# Patient Record
Sex: Male | Born: 1965 | Race: White | Hispanic: No | Marital: Single | Smoking: Current every day smoker
Health system: Southern US, Community
[De-identification: ages and names within clinical notes are randomized; demographics above are authoritative.]

---

## 2021-07-12 ENCOUNTER — Emergency Department (HOSPITAL_COMMUNITY): Payer: BLUE CROSS/BLUE SHIELD

## 2021-07-12 ENCOUNTER — Encounter (HOSPITAL_COMMUNITY): Payer: Self-pay

## 2021-07-12 ENCOUNTER — Other Ambulatory Visit: Payer: Self-pay

## 2021-07-12 ENCOUNTER — Emergency Department (HOSPITAL_COMMUNITY)
Admission: EM | Admit: 2021-07-12 | Discharge: 2021-07-12 | Disposition: A | Discharge status: Home / Self Care | Payer: BLUE CROSS/BLUE SHIELD | Attending: Emergency Medicine | Admitting: Emergency Medicine

## 2021-07-12 DIAGNOSIS — Z955 Presence of coronary angioplasty implant and graft: Secondary | ICD-10-CM | POA: Insufficient documentation

## 2021-07-12 DIAGNOSIS — I2510 Atherosclerotic heart disease of native coronary artery without angina pectoris: Secondary | ICD-10-CM | POA: Diagnosis not present

## 2021-07-12 DIAGNOSIS — R519 Headache, unspecified: Secondary | ICD-10-CM | POA: Diagnosis not present

## 2021-07-12 DIAGNOSIS — R5383 Other fatigue: Secondary | ICD-10-CM | POA: Insufficient documentation

## 2021-07-12 DIAGNOSIS — R5381 Other malaise: Secondary | ICD-10-CM | POA: Diagnosis not present

## 2021-07-12 DIAGNOSIS — R7309 Other abnormal glucose: Secondary | ICD-10-CM | POA: Insufficient documentation

## 2021-07-12 DIAGNOSIS — R531 Weakness: Secondary | ICD-10-CM | POA: Insufficient documentation

## 2021-07-12 LAB — TSH: TSH: 1.052 u[IU]/mL (ref 0.350–4.500)

## 2021-07-12 LAB — CBC WITH DIFFERENTIAL/PLATELET
Abs Immature Granulocytes: 0.01 10*3/uL (ref 0.00–0.07)
Basophils Absolute: 0 10*3/uL (ref 0.0–0.1)
Basophils Relative: 0 %
Eosinophils Absolute: 0 10*3/uL (ref 0.0–0.5)
Eosinophils Relative: 1 %
HCT: 42.7 % (ref 39.0–52.0)
Hemoglobin: 14.4 g/dL (ref 13.0–17.0)
Immature Granulocytes: 0 %
Lymphocytes Relative: 20 %
Lymphs Abs: 1.4 10*3/uL (ref 0.7–4.0)
MCH: 32.4 pg (ref 26.0–34.0)
MCHC: 33.7 g/dL (ref 30.0–36.0)
MCV: 96.2 fL (ref 80.0–100.0)
Monocytes Absolute: 0.5 10*3/uL (ref 0.1–1.0)
Monocytes Relative: 6 %
Neutro Abs: 5.4 10*3/uL (ref 1.7–7.7)
Neutrophils Relative %: 73 %
Platelets: 248 10*3/uL (ref 150–400)
RBC: 4.44 MIL/uL (ref 4.22–5.81)
RDW: 12.4 % (ref 11.5–15.5)
WBC: 7.4 10*3/uL (ref 4.0–10.5)
nRBC: 0 % (ref 0.0–0.2)

## 2021-07-12 LAB — MAGNESIUM: Magnesium: 2.1 mg/dL (ref 1.7–2.4)

## 2021-07-12 LAB — COMPREHENSIVE METABOLIC PANEL
ALT: 25 U/L (ref 0–44)
AST: 21 U/L (ref 15–41)
Albumin: 3.9 g/dL (ref 3.5–5.0)
Alkaline Phosphatase: 49 U/L (ref 38–126)
Anion gap: 7 (ref 5–15)
BUN: 21 mg/dL — ABNORMAL HIGH (ref 6–20)
CO2: 25 mmol/L (ref 22–32)
Calcium: 9.3 mg/dL (ref 8.9–10.3)
Chloride: 108 mmol/L (ref 98–111)
Creatinine, Ser: 0.99 mg/dL (ref 0.61–1.24)
GFR, Estimated: >60 mL/min (ref >60)
Glucose, Bld: 119 mg/dL — ABNORMAL HIGH (ref 70–99)
Potassium: 4 mmol/L (ref 3.5–5.1)
Sodium: 140 mmol/L (ref 135–145)
Total Bilirubin: 0.6 mg/dL (ref 0.3–1.2)
Total Protein: 7.1 g/dL (ref 6.5–8.1)

## 2021-07-12 LAB — CK: Total CK: 84 U/L (ref 49–397)

## 2021-07-12 LAB — TROPONIN I (HIGH SENSITIVITY): Troponin I (High Sensitivity): 3 ng/L (ref <18)

## 2021-07-12 LAB — CBG MONITORING, ED: Glucose-Capillary: 106 mg/dL — ABNORMAL HIGH (ref 70–99)

## 2021-07-12 MED ORDER — SODIUM CHLORIDE 0.9 % IV BOLUS
1000.0000 mL | Freq: Once | INTRAVENOUS | Status: AC
  Administered 2021-07-12: 1000 mL via INTRAVENOUS

## 2021-07-12 NOTE — ED Provider Triage Note (Signed)
Emergency Medicine Provider Triage Evaluation Note  Shawn Macdonald , a 56 y.o. male  was evaluated in triage.  Pt complains of "feeling weird."  He reports that he woke up this morning and was feeling very weak and tingling all over.  Says that he may just be tired but something feels wrong.  Reports that on Wednesday he had a root canal and over the last few days he has also had a cold.  Has a history of STEMI but says this feels nothing like it.  Review of Systems  Positive: Tingly, fatigued and weak Negative: Chest pain, shortness of breath, syncope, palpitations  Physical Exam  BP 119/73 (BP Location: Right Arm)   Pulse 65   Temp 97.8 F (36.6 C) (Oral)   Resp 18   Ht 6' (1.829 m)   Wt 72.6 kg   SpO2 98%   BMI 21.70 kg/m  Gen:   Awake, no distress   Resp:  Normal effort  MSK:   Moves extremities without difficulty  Other:  Well-appearing.  No facial droop or word slurring.  Negative stroke scale, Van negative.  Medical Decision Making  Medically screening exam initiated at 10:27 AM.  Appropriate orders placed.  Shawn Macdonald was informed that the remainder of the evaluation will be completed by another provider, this initial triage assessment does not replace that evaluation, and the importance of remaining in the ED until their evaluation is complete.     Shawn Hammock, PA-C 07/12/21 1034

## 2021-07-12 NOTE — ED Notes (Signed)
Pt transported to xray 

## 2021-07-12 NOTE — ED Triage Notes (Signed)
Reports feeling weak and feeling worse by the minute.  Reports ate a fruit cup this am.  Patient complains of mild headache.  Patient denies any other pain.  Patient can't describes symptoms just keeps stating "something isnt right and I dont feel well I feel like I am giong to pass out".

## 2021-07-12 NOTE — ED Notes (Signed)
No pain

## 2021-07-12 NOTE — ED Provider Notes (Signed)
[facility] - Inpatient EMERGENCY DEPARTMENT Provider Note   CSN: [id number] Arrival date & time: 07/12/21  [id number]     History  Chief Complaint  Patient presents with   Weakness    Shawn Macdonald is a 56 y.o. male.  HPI Patient reports he is felt kind of generally weak and just not like himself the past day.  Patient does have history of coronary artery disease and LAD stent.  Also history of hyperlipidemia.  Patient got Repatha injection about 4 weeks ago.  He reports he did not feel well afterwards and thus did not go back for the 2-week repeat dosing.  No specific symptoms but he did feel kind of achy in the muscles in the joints.  Then about 2 weeks ago he felt pain specifically in the right shoulder and was seen by orthopedics and got a steroid injection in the right shoulder.  Patient is quite busy.  He had a 3 AM meeting this morning.  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  Reports he only got about 4 hours of sleep.  No drug or alcohol use.  Patient also had a root canal initiated yesterday.  He reports he is having no pain and did not need to take any pain medications.  He reports that he did get prescribed a Z-Pak but he has not yet started it.  Patient reports that after he went to the dentist he also then went to the chiropractor because he felt a little bit of muscular neck stiffness after sitting in the dental chair.  He denies any significant neck pain at this time.  He reports he has a little bit of muscular discomfort on the right but no significant pain.  He reports a mild frontal headache.  He thinks it is sinus related.  No documented fevers chills.  No rashes.  Patient denies having chest pain or shortness of breath and does not feel like this is similar to prior heart attack.    Home Medications Prior to Admission medications   Not on File      Allergies    Cephalexin, Iodinated contrast media, Doxycycline, Penicillins, Pravastatin, Rosuvastatin,  Nsaids, and Molds & smuts    Review of Systems   Review of Systems 10 systems reviewed negative except as per HPI Physical Exam Updated Vital Signs BP 104/74   Pulse 63   Temp 98.2 F (36.8 C)   Resp 13   Ht 6' (1.829 m)   Wt 72.6 kg   SpO2 98%   BMI 21.70 kg/m  Physical Exam Constitutional:      Appearance: Normal appearance.     Comments: Well-nourished well-developed.  No acute distress.  HENT:     Head:     Comments: No facial swelling or tenderness.    Right Ear: Tympanic membrane normal.     Left Ear: Tympanic membrane normal.     Nose: Nose normal.     Mouth/Throat:     Mouth: Mucous membranes are moist.     Pharynx: Oropharynx is clear.     Comments: Mucous membranes and posterior oropharynx are normal.  Dentition in the upper right showed some dental packing within the molars.  However the general condition is good.  There is no erythema or beefiness around the gums.  No tenderness no facial swelling no drainage. Eyes:     Extraocular Movements: Extraocular movements intact.     Pupils: Pupils are equal, round, and reactive  to light.  Cardiovascular:     Rate and Rhythm: Normal rate and regular rhythm.     Pulses: Normal pulses.     Heart sounds: Normal heart sounds.  Pulmonary:     Effort: Pulmonary effort is normal.     Breath sounds: Normal breath sounds.  Abdominal:     General: There is no distension.     Palpations: Abdomen is soft.     Tenderness: There is no abdominal tenderness. There is no guarding.  Musculoskeletal:        General: No swelling or tenderness. Normal range of motion.     Cervical back: Normal range of motion and neck supple. No rigidity or tenderness.     Right lower leg: No edema.     Left lower leg: No edema.  Lymphadenopathy:     Cervical: No cervical adenopathy.  Skin:    General: Skin is warm and dry.  Neurological:     General: No focal deficit present.     Mental Status: He is alert and oriented to person, place, and  time.     Motor: No weakness.     Coordination: Coordination normal.  Psychiatric:        Mood and Affect: Mood normal.    ED Results / Procedures / Treatments   Labs (all labs ordered are listed, but only abnormal results are displayed) Labs Reviewed  COMPREHENSIVE METABOLIC PANEL - Abnormal; Notable for the following components:      Result Value   Glucose, Bld 119 (*)    BUN 21 (*)    All other components within normal limits  CBG MONITORING, ED - Abnormal; Notable for the following components:   Glucose-Capillary 106 (*)    All other components within normal limits  CBC WITH DIFFERENTIAL/PLATELET  MAGNESIUM  TSH  CK  URINALYSIS, ROUTINE W REFLEX MICROSCOPIC  RAPID URINE DRUG SCREEN, HOSP PERFORMED  TROPONIN I (HIGH SENSITIVITY)  TROPONIN I (HIGH SENSITIVITY)    EKG EKG Interpretation  Date/Time:  Friday July 12 2021 10:06:57 EDT Ventricular Rate:  64 PR Interval:  152 QRS Duration: 90 QT Interval:  412 QTC Calculation: 425 R Axis:   91 Text Interpretation: Normal sinus rhythm Rightward axis Borderline ECG No previous ECGs available normal, no old comparison Confirmed by Arby Barrette [phone]) on 07/12/2021 11:47:50 AM  Radiology DG Chest 2 View  Result Date: 07/12/2021 CLINICAL DATA:  Weakness EXAM: CHEST - 2 VIEW COMPARISON:  None Available. FINDINGS: The heart size and mediastinal contours are within normal limits. Both lungs are clear. The visualized skeletal structures are unremarkable. IMPRESSION: No active cardiopulmonary disease. Electronically Signed   By: Allegra Lai M.D.   On: 07/12/2021 12:33   CT Head Wo Contrast  Result Date: 07/12/2021 CLINICAL DATA:  Mental status changes of unknown cause, weakness, mild headache EXAM: CT HEAD WITHOUT CONTRAST TECHNIQUE: Contiguous axial images were obtained from the base of the skull through the vertex without intravenous contrast. RADIATION DOSE REDUCTION: This exam was performed according to the departmental  dose-optimization program which includes automated exposure control, adjustment of the mA and/or kV according to patient size and/or use of iterative reconstruction technique. COMPARISON:  None FINDINGS: Brain: Normal ventricular morphology. No midline shift or mass effect. Normal appearance of brain parenchyma. No intracranial hemorrhage, mass lesion, evidence of acute infarction, or extra-axial fluid collection. Vascular: No hyperdense vessels Skull: Intact Sinuses/Orbits: Clear Other: N/A IMPRESSION: Normal exam. Electronically Signed   By: Angelyn Punt.D.  On: 07/12/2021 12:54    Procedures Procedures    Medications Ordered in ED Medications  sodium chloride 0.9 % bolus 1,000 mL (0 mLs Intravenous Stopped 07/12/21 1435)    ED Course/ Medical Decision Making/ A&P                           Medical Decision Making Amount and/or Complexity of Data Reviewed Labs: ordered. Radiology: ordered.   Patient has generalized symptoms.  He is experiencing malaise and feeling of generalized weakness with mild frontal headache.  Patient has some significant risk factors for coronary artery disease with prior LAD and stent.  Will include EKG and troponin and diagnostic evaluation for atypical presentation.  Patient also had recent dental procedure, prelim for root canal, although by exam and history there do not appear to be complications.  CBC has returned without elevation and basic chemistries with LFTs are normal.  Will add CT head and TSH\CK/urinalysis.  At this time vital signs are stable and clinically patient is stable.  Will administer 1 L of fluids and reassess.  Diagnostic work-up without significant findings.  Patient remains clinically well in appearance.  Vital signs remained stable.  At this time, without did infallible source of infection\ACS ruled out\CT head without acute findings, I feel patient is stable for discharge at this time.  Discussed plan of increased rest for the next 2 days,  hydration and healthy diet.  If symptoms are evolving or new concerning symptoms develop, patient is to return to the emergency department for recheck.  Otherwise he is to follow-up with PCP within the next 3 to 7 days.        Final Clinical Impression(s) / ED Diagnoses Final diagnoses:  Weakness  Malaise and fatigue    Rx / DC Orders ED Discharge Orders     None         Charlesetta Shanks, MD 07/12/21 1720

## 2021-07-12 NOTE — Discharge Instructions (Signed)
1.  At this time the cause of your symptoms is unclear.  Your evaluation in the emergency department is not showing abnormalities. 2.  You may be developing a virus or other illness.  At this time rest for the next 24 to 48 hours, take plenty of fluids and eat a healthy diet.  Take extra strength Tylenol if needed for achiness. 3.  Start the Zithromax as prescribed by your dentist for your dental procedure.  At this time there do not appear to be complications from your dental procedure. 4.  Return to the emergency department if you are developing fevers, chest pain, cough, confusion or other specific symptoms or concerning worsening symptoms.  Schedule follow-up with your doctor in the next 3 to 5 days for recheck.

## 2023-05-09 IMAGING — CT CT HEAD W/O CM
4 series · 16 of 47 positions shown, 18 images · non-contrast
Comparison: None

CLINICAL DATA: Mental status changes of unknown cause, weakness,
mild headache



[Series 3: head without · axial · non-contrast · 0.45mm/px · z∈[-106,+14]mm · 7 of 34 slices shown, 9 images]
[im 5/34  brain]
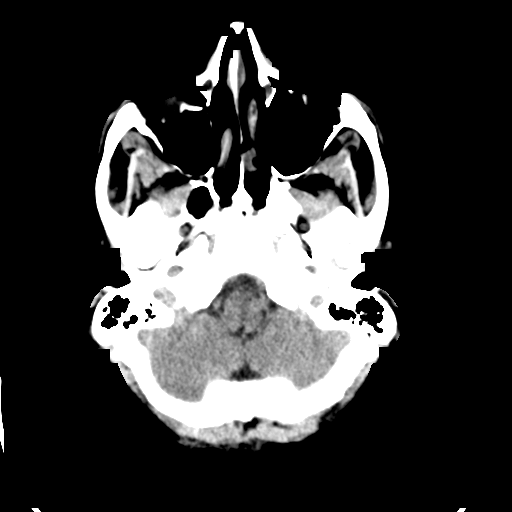
[im 5/34  bone]
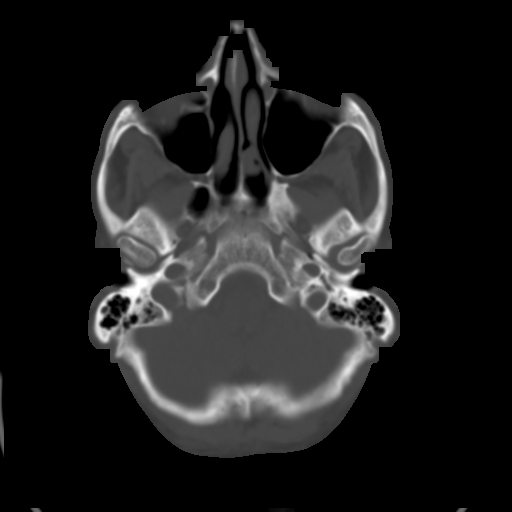
[im 9/34  brain]
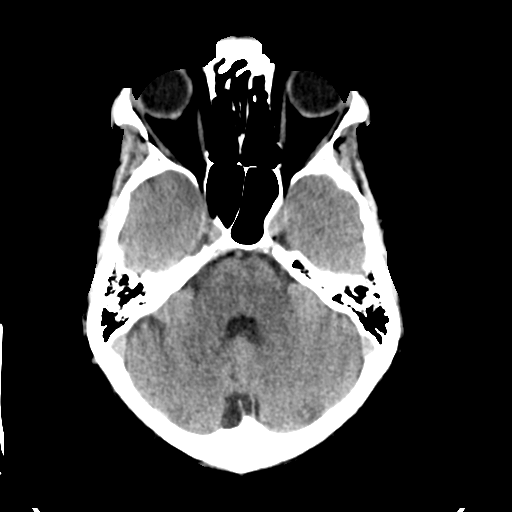
[im 13/34  brain]
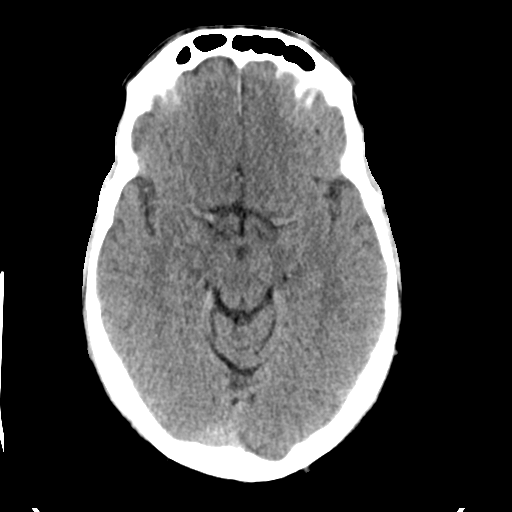
[im 17/34  brain]
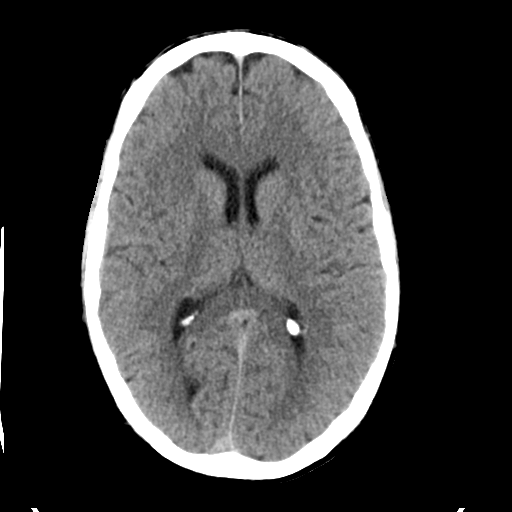
[im 21/34  brain]
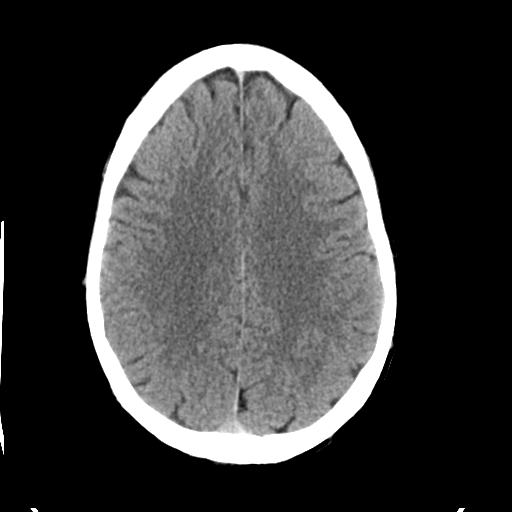
[im 21/34  bone]
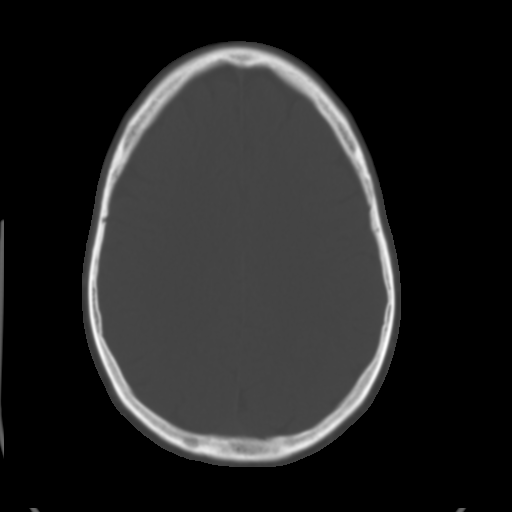
[im 25/34  brain]
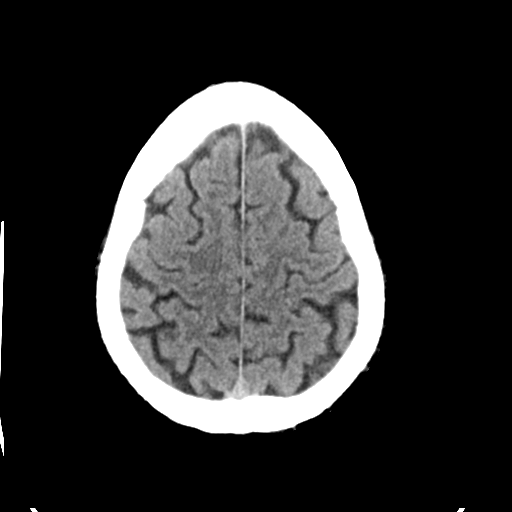
[im 29/34  brain]
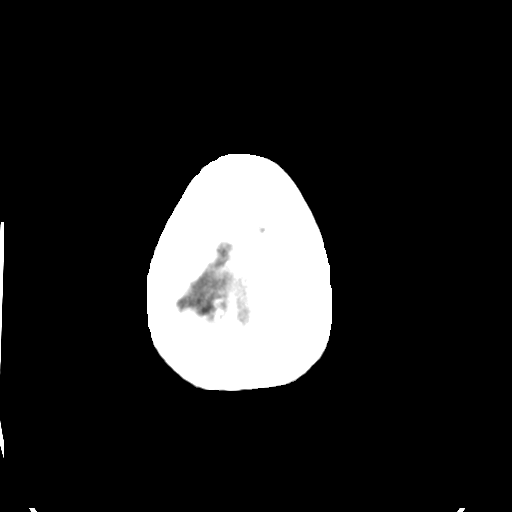

[Series 4: head bone · axial · 0.45mm/px · z∈[-110,-78]mm · 3 of 84 slices shown]
[im 9/84  bone]
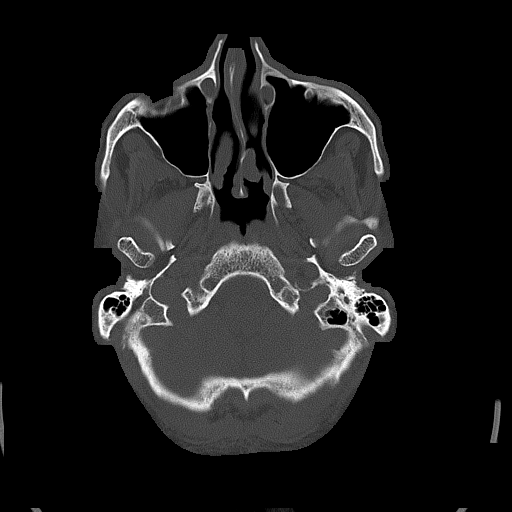
[im 17/84  bone]
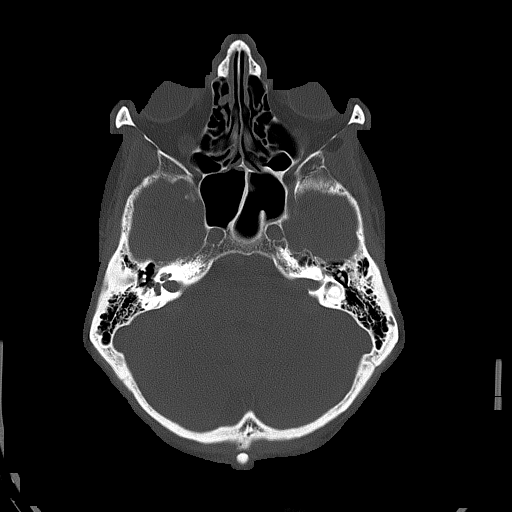
[im 25/84  bone]
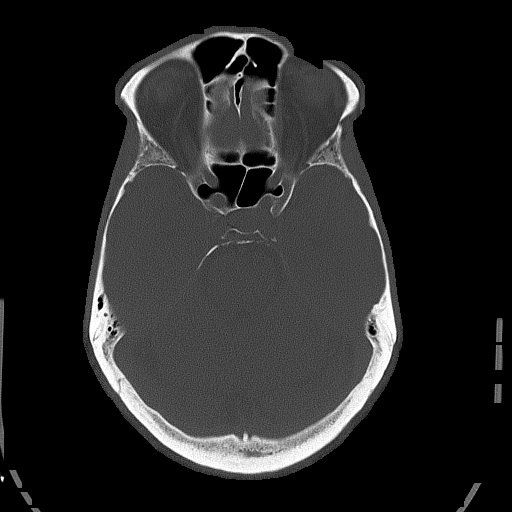

[Series 5: head without cor · coronal · non-contrast · 0.34mm/px · 3 of 73 slices shown]
[im 25/73  brain]
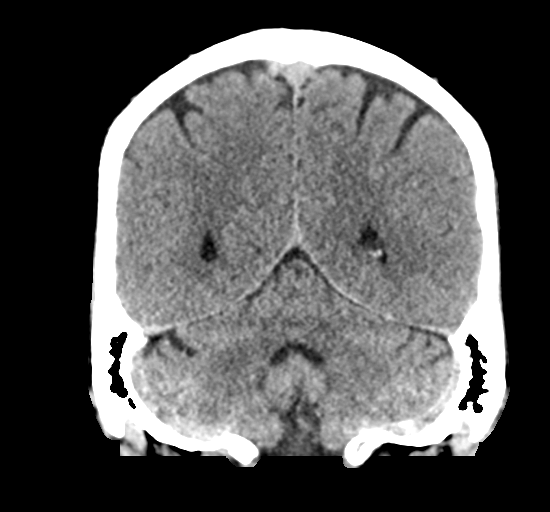
[im 33/73  brain]
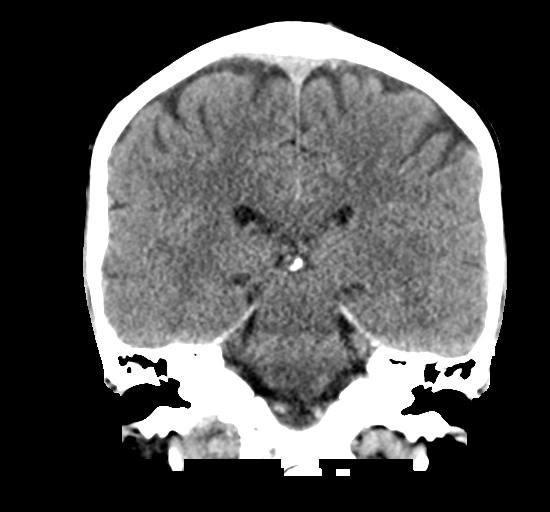
[im 41/73  brain]
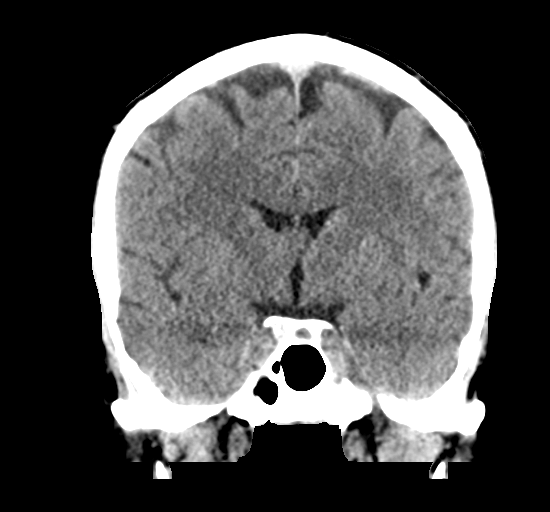

[Series 6: head without sag · sagittal · non-contrast · 0.33mm/px · 3 of 61 slices shown]
[im 21/61  brain]
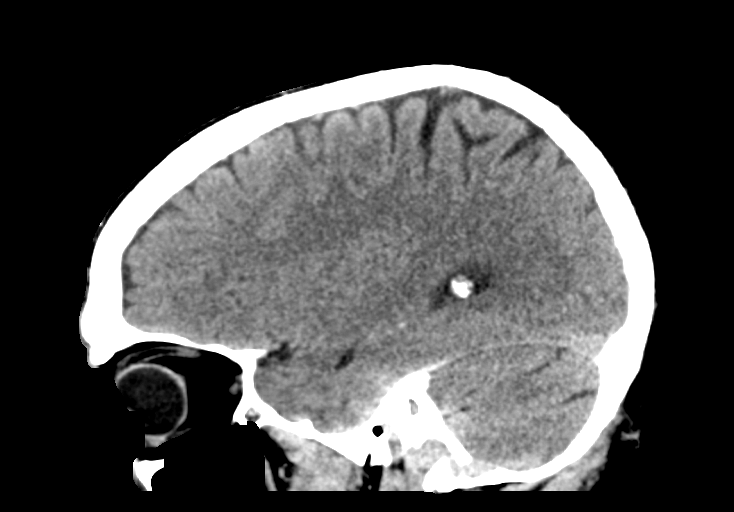
[im 31/61  brain]
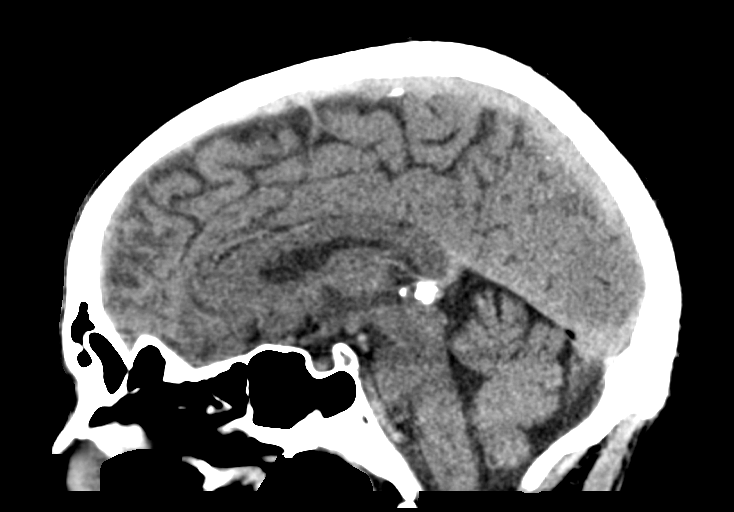
[im 41/61  brain]
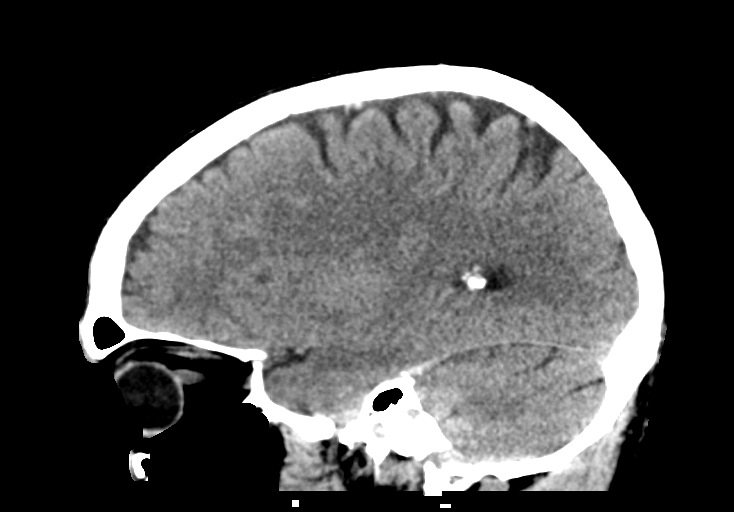

[16 of 47 positions shown; findings below may reference images not displayed]

FINDINGS: Brain: Normal ventricular morphology. No midline shift or mass
effect. Normal appearance of brain parenchyma. No intracranial
hemorrhage, mass lesion, evidence of acute infarction, or
extra-axial fluid collection.

Vascular: No hyperdense vessels

Skull: Intact

Sinuses/Orbits: Clear

Other: N/A
IMPRESSION: Normal exam.
